# Patient Record
Sex: Female | Born: 1935 | Race: White | Hispanic: No | Smoking: Never smoker
Health system: Southern US, Community
[De-identification: ages and names within clinical notes are randomized; demographics above are authoritative.]

## PROBLEM LIST (undated history)

## (undated) DIAGNOSIS — I10 Essential (primary) hypertension: Secondary | ICD-10-CM

## (undated) DIAGNOSIS — F0390 Unspecified dementia without behavioral disturbance: Secondary | ICD-10-CM

## (undated) DIAGNOSIS — E7800 Pure hypercholesterolemia, unspecified: Secondary | ICD-10-CM

## (undated) DIAGNOSIS — E079 Disorder of thyroid, unspecified: Secondary | ICD-10-CM

## (undated) DIAGNOSIS — I219 Acute myocardial infarction, unspecified: Secondary | ICD-10-CM

## (undated) HISTORY — PX: BREAST SURGERY: SHX581

## (undated) HISTORY — PX: CORONARY ANGIOPLASTY WITH STENT PLACEMENT: SHX49

## (undated) HISTORY — PX: OOPHORECTOMY: SHX86

---

## 2016-12-23 ENCOUNTER — Emergency Department (HOSPITAL_BASED_OUTPATIENT_CLINIC_OR_DEPARTMENT_OTHER): Payer: Medicare Other

## 2016-12-23 ENCOUNTER — Emergency Department (HOSPITAL_BASED_OUTPATIENT_CLINIC_OR_DEPARTMENT_OTHER)
Admission: EM | Admit: 2016-12-23 | Discharge: 2016-12-23 | Disposition: A | Discharge status: Home / Self Care | Payer: Medicare Other | Attending: Emergency Medicine | Admitting: Emergency Medicine

## 2016-12-23 ENCOUNTER — Encounter (HOSPITAL_BASED_OUTPATIENT_CLINIC_OR_DEPARTMENT_OTHER): Payer: Self-pay

## 2016-12-23 DIAGNOSIS — I10 Essential (primary) hypertension: Secondary | ICD-10-CM | POA: Insufficient documentation

## 2016-12-23 DIAGNOSIS — R059 Cough, unspecified: Secondary | ICD-10-CM

## 2016-12-23 DIAGNOSIS — R911 Solitary pulmonary nodule: Secondary | ICD-10-CM

## 2016-12-23 DIAGNOSIS — R05 Cough: Secondary | ICD-10-CM

## 2016-12-23 HISTORY — DX: Essential (primary) hypertension: I10

## 2016-12-23 HISTORY — DX: Disorder of thyroid, unspecified: E07.9

## 2016-12-23 HISTORY — DX: Pure hypercholesterolemia, unspecified: E78.00

## 2016-12-23 HISTORY — DX: Unspecified dementia, unspecified severity, without behavioral disturbance, psychotic disturbance, mood disturbance, and anxiety: F03.90

## 2016-12-23 HISTORY — DX: Acute myocardial infarction, unspecified: I21.9

## 2016-12-23 LAB — CBC WITH DIFFERENTIAL/PLATELET
Basophils Absolute: 0 10*3/uL (ref 0.0–0.1)
Basophils Relative: 0 %
EOS PCT: 3 %
Eosinophils Absolute: 0.1 10*3/uL (ref 0.0–0.7)
HEMATOCRIT: 36.1 % (ref 36.0–46.0)
HEMOGLOBIN: 11.2 g/dL — AB (ref 12.0–15.0)
LYMPHS ABS: 1.1 10*3/uL (ref 0.7–4.0)
LYMPHS PCT: 33 %
MCH: 30.4 pg (ref 26.0–34.0)
MCHC: 31 g/dL (ref 30.0–36.0)
MCV: 98.1 fL (ref 78.0–100.0)
Monocytes Absolute: 0.5 10*3/uL (ref 0.1–1.0)
Monocytes Relative: 14 %
NEUTROS ABS: 1.6 10*3/uL — AB (ref 1.7–7.7)
Neutrophils Relative %: 50 %
Platelets: 150 10*3/uL (ref 150–400)
RBC: 3.68 MIL/uL — AB (ref 3.87–5.11)
RDW: 13.3 % (ref 11.5–15.5)
WBC: 3.2 10*3/uL — ABNORMAL LOW (ref 4.0–10.5)

## 2016-12-23 LAB — BASIC METABOLIC PANEL
Anion gap: 7 (ref 5–15)
BUN: 19 mg/dL (ref 6–20)
CHLORIDE: 105 mmol/L (ref 101–111)
CO2: 30 mmol/L (ref 22–32)
Calcium: 9 mg/dL (ref 8.9–10.3)
Creatinine, Ser: 1.09 mg/dL — ABNORMAL HIGH (ref 0.44–1.00)
GFR calc Af Amer: 50 mL/min — ABNORMAL LOW (ref >60)
GFR calc non Af Amer: 43 mL/min — ABNORMAL LOW (ref >60)
GLUCOSE: 94 mg/dL (ref 65–99)
POTASSIUM: 4.2 mmol/L (ref 3.5–5.1)
Sodium: 142 mmol/L (ref 135–145)

## 2016-12-23 MED ORDER — DOXYCYCLINE HYCLATE 100 MG PO CAPS: 100.0000 mg | ORAL_CAPSULE | Freq: Two times a day (BID) | ORAL | 0 refills | Status: DC

## 2016-12-23 NOTE — Discharge Instructions (Signed)
Your chest x-ray does not show obvious pneumonia, but given 3-4 weeks of your symptoms 12 antibiotics may be warranted. Please take antibiotics as prescribed.  View chest x-ray also shows  lung nodule in the right lower part of the lung. Please discuss this is their primary care doctor's you may need repeat x-rays down the road to make sure that nothing has changed with this nodule.  Please return without fail for worsening symptoms, including confusion, new fevers, difficulty breathing, or any other symptoms concerning to you

## 2016-12-23 NOTE — ED Notes (Signed)
C/o cough x 3 weeks  Productive at times, yellow  Denies pain

## 2016-12-23 NOTE — ED Provider Notes (Signed)
[facility]   CSN: [id number] Arrival date & time: 12/23/16  1621   By signing my name below, I, Nelwyn SalisburyJoshua Fowler, attest that this documentation has been prepared under the direction and in the presence of Lavera Guiseana Duo Pamila Mendibles, MD . Electronically Signed: Nelwyn SalisburyJoshua Fowler, Scribe. 12/23/2016. 7:49 PM.  History   Chief Complaint Chief Complaint  Patient presents with  . Cough   HPI  HPI Comments:  Suzanne Caldwell is a 83 y.o. female with pmhx of MI HTN and HLD who presents to the Emergency Department with daughter complaining of sudden-onset, persistent, non-productive cough onset 3 weeks ago. No modifying factors indicated. Pt reports associated rhinorrhea. She denies any sore throat, congestion, fever, chills, LE swelling, vomiting, diarrhea, dysuria, CP or SOB. Pt also denies any recent sick contacts.   Past Medical History:  Diagnosis Date  . Dementia   . High cholesterol   . Hypertension   . Myocardial infarct   . Thyroid disease     There are no active problems to display for this patient.   Past Surgical History:  Procedure Laterality Date  . BREAST SURGERY    . CORONARY ANGIOPLASTY WITH STENT PLACEMENT    . OOPHORECTOMY      OB History    No data available       Home Medications    Prior to Admission medications   Medication Sig Start Date End Date Taking? Authorizing Provider  doxycycline (VIBRAMYCIN) 100 MG capsule Take 1 capsule (100 mg total) by mouth 2 (two) times daily. 12/23/16   Lavera Guiseana Duo Peggy Loge, MD    Family History No family history on file.  Social History Social History  Substance Use Topics  . Smoking status: Never Smoker  . Smokeless tobacco: Never Used  . Alcohol use No     Allergies   Penicillins and Sulfa antibiotics   Review of Systems Review of Systems 10/14 Systems reviewed and are negative for acute change except as noted in the HPI.   Physical Exam Updated Vital Signs BP 158/76 (BP Location: Right Arm)    Pulse 72   Temp 97.9 F (36.6 C) (Oral)   Resp 18   Ht 5' (1.524 m)   Wt 173 lb (78.5 kg)   SpO2 96%   BMI 33.79 kg/m   Physical Exam Physical Exam  Nursing note and vitals reviewed. Constitutional: Well developed, well nourished, non-toxic, and in no acute distress Head: Normocephalic and atraumatic.  Mouth/Throat: Oropharynx is clear and moist.  Neck: Normal range of motion. Neck supple.  Cardiovascular: Normal rate and regular rhythm.   Pulmonary/Chest: Effort normal and breath sounds normal. Congestive cough.  Abdominal: Soft. There is no tenderness. There is no rebound and no guarding.  Musculoskeletal: Normal range of motion.  Neurological: Alert, no facial droop, fluent speech, moves all extremities symmetrically Skin: Skin is warm and dry.  Psychiatric: Cooperative  ED Treatments / Results  DIAGNOSTIC STUDIES:  Oxygen Saturation is 96% on RA, normal by my interpretation.    COORDINATION OF CARE:  9:27 PM Discussed treatment plan with pt at bedside and pt agreed to plan.  Labs (all labs ordered are listed, but only abnormal results are displayed) Labs Reviewed  CBC WITH DIFFERENTIAL/PLATELET - Abnormal; Notable for the following:       Result Value   WBC 3.2 (*)    RBC 3.68 (*)    Hemoglobin 11.2 (*)    Neutro Abs 1.6 (*)    All other components  within normal limits  BASIC METABOLIC PANEL - Abnormal; Notable for the following:    Creatinine, Ser 1.09 (*)    GFR calc non Af Amer 43 (*)    GFR calc Af Amer 50 (*)    All other components within normal limits    EKG  EKG Interpretation None       Radiology Dg Chest 2 View  Result Date: 12/23/2016 CLINICAL DATA:  Cough for 3 weeks EXAM: CHEST  2 VIEW COMPARISON:  None. FINDINGS: Normal heart size. Low volumes. Bibasilar atelectasis. 1 cm nodular density at the lateral left base. No pneumothorax or pleural effusion. Kyphosis of the thoracolumbar junction is noted. Chronic appearing L1 compression  deformity. IMPRESSION: Bibasilar atelectasis. Left basilar nodular density. CT is recommended to further evaluate. Electronically Signed   By: Jolaine Click M.D.   On: 12/23/2016 17:16    Procedures Procedures (including critical care time)  Medications Ordered in ED Medications - No data to display   Initial Impression / Assessment and Plan / ED Course  I have reviewed the triage vital signs and the nursing notes.  Pertinent labs & imaging results that were available during my care of the patient were reviewed by me and considered in my medical decision making (see chart for details).  Clinical Course as of Dec 23 2125  Fri Dec 23, 2016  2031 DG Chest 2 View [JF]    Clinical Course User Index [JF] Nelwyn Salisbury    Presenting with cough and congestion for over 3 weeks. She is nontoxic in no acute distress with normal vital signs. Normal work of breathing on room air with normal oxygenation. Lungs overall clear. Chest x-ray visualized lung nodule but no infiltrate or evidence of pneumonia or edema or other acute cardiopulmonary processes. Given length of symptoms, we'll trial course of antibiotics. No major systemic signs or symptoms of illness or respiratory distress that require admission. Strict return and follow-up instructions reviewed. She expressed understanding of all discharge instructions and felt comfortable with the plan of care.   Final Clinical Impressions(s) / ED Diagnoses   Final diagnoses:  Cough  Lung nodule    New Prescriptions New Prescriptions   DOXYCYCLINE (VIBRAMYCIN) 100 MG CAPSULE    Take 1 capsule (100 mg total) by mouth 2 (two) times daily.   I personally performed the services described in this documentation, which was scribed in my presence. The recorded information has been reviewed and is accurate.     Lavera Guise, MD 12/23/16 2127

## 2016-12-23 NOTE — ED Triage Notes (Signed)
C/o cough x 2 weeks- per daughter-pt NAD-steady gait

## 2017-10-24 IMAGING — DX DG CHEST 2V
2 series · 2 of 2 positions shown · non-contrast
Comparison: None.

CLINICAL DATA: Cough for 3 weeks

EXAM:
CHEST  2 VIEW

[chest pa]
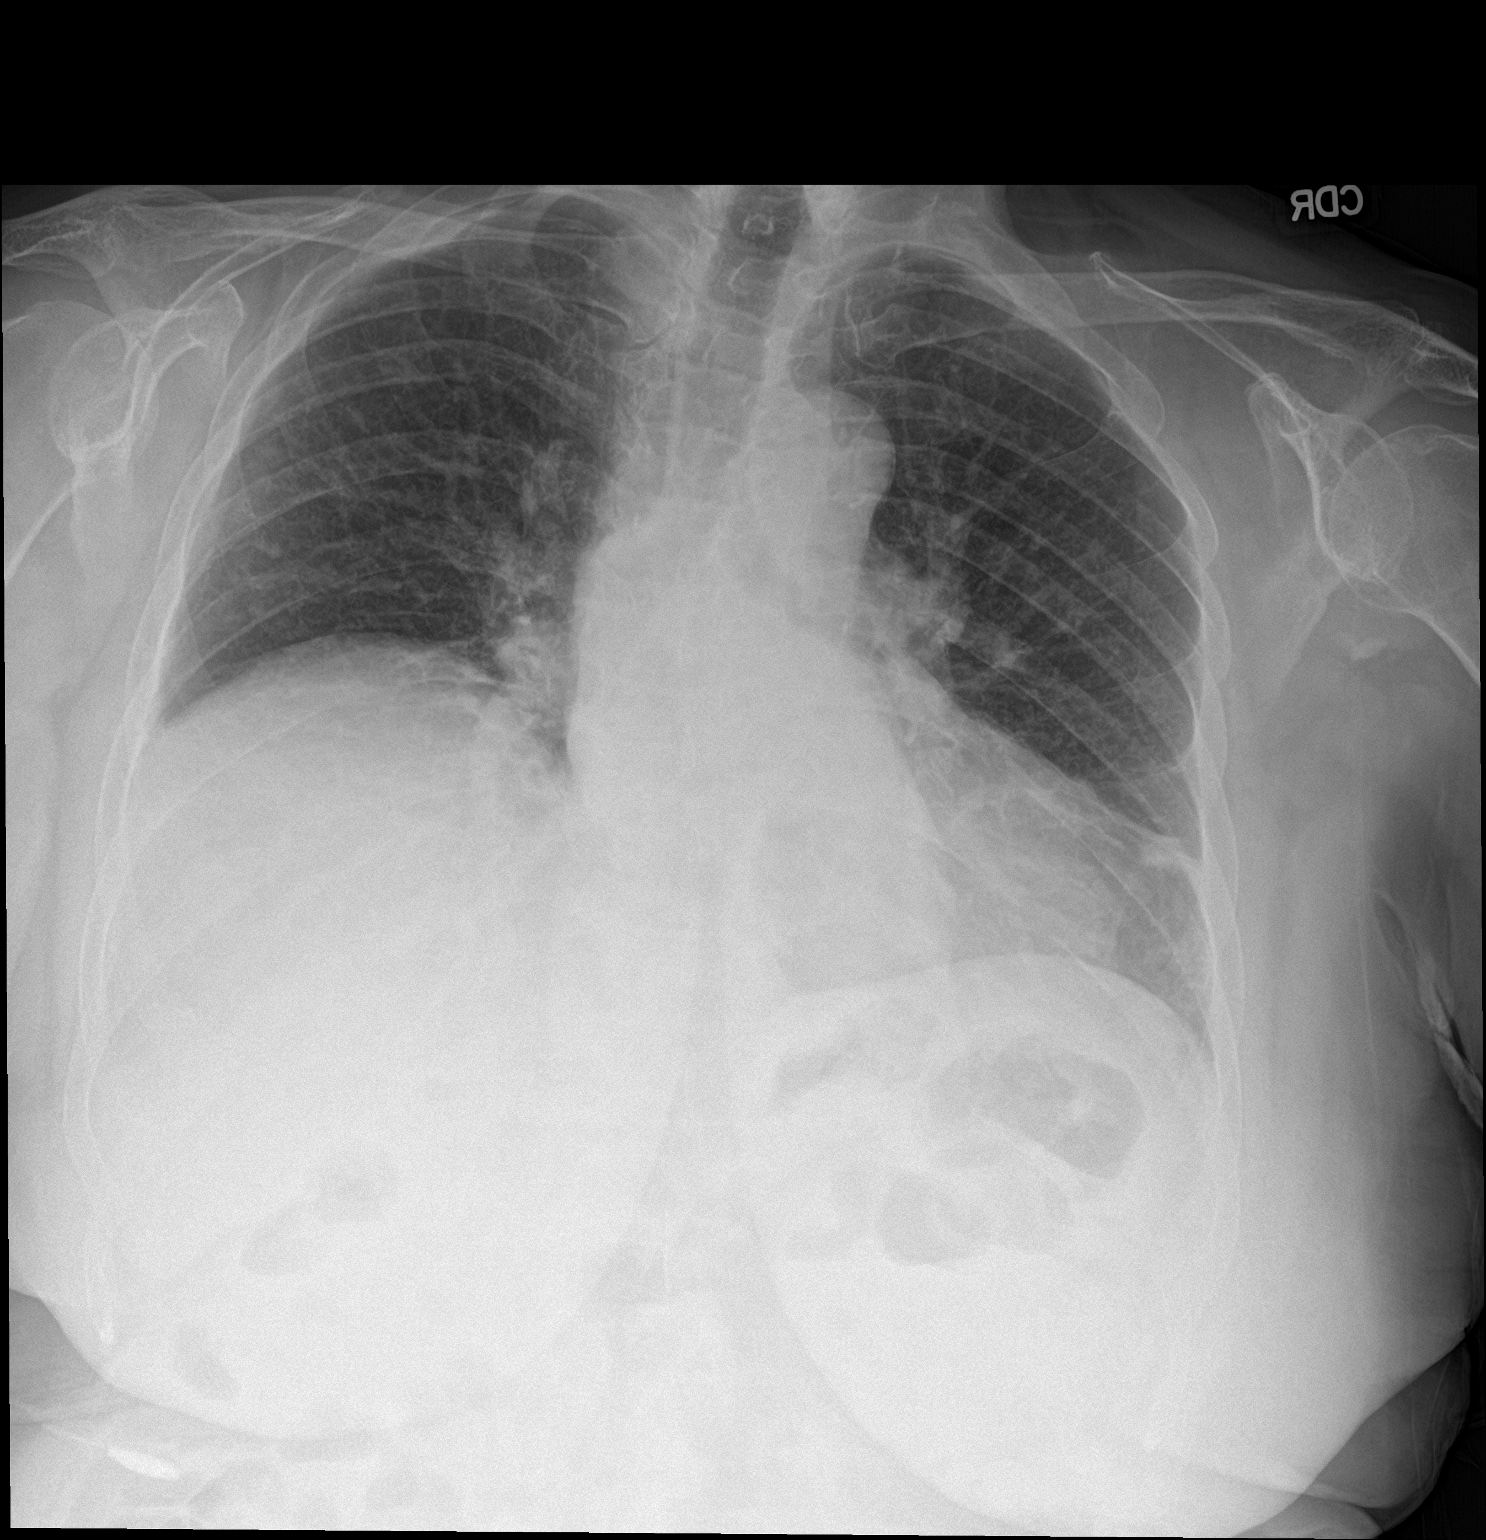

[chest lat]
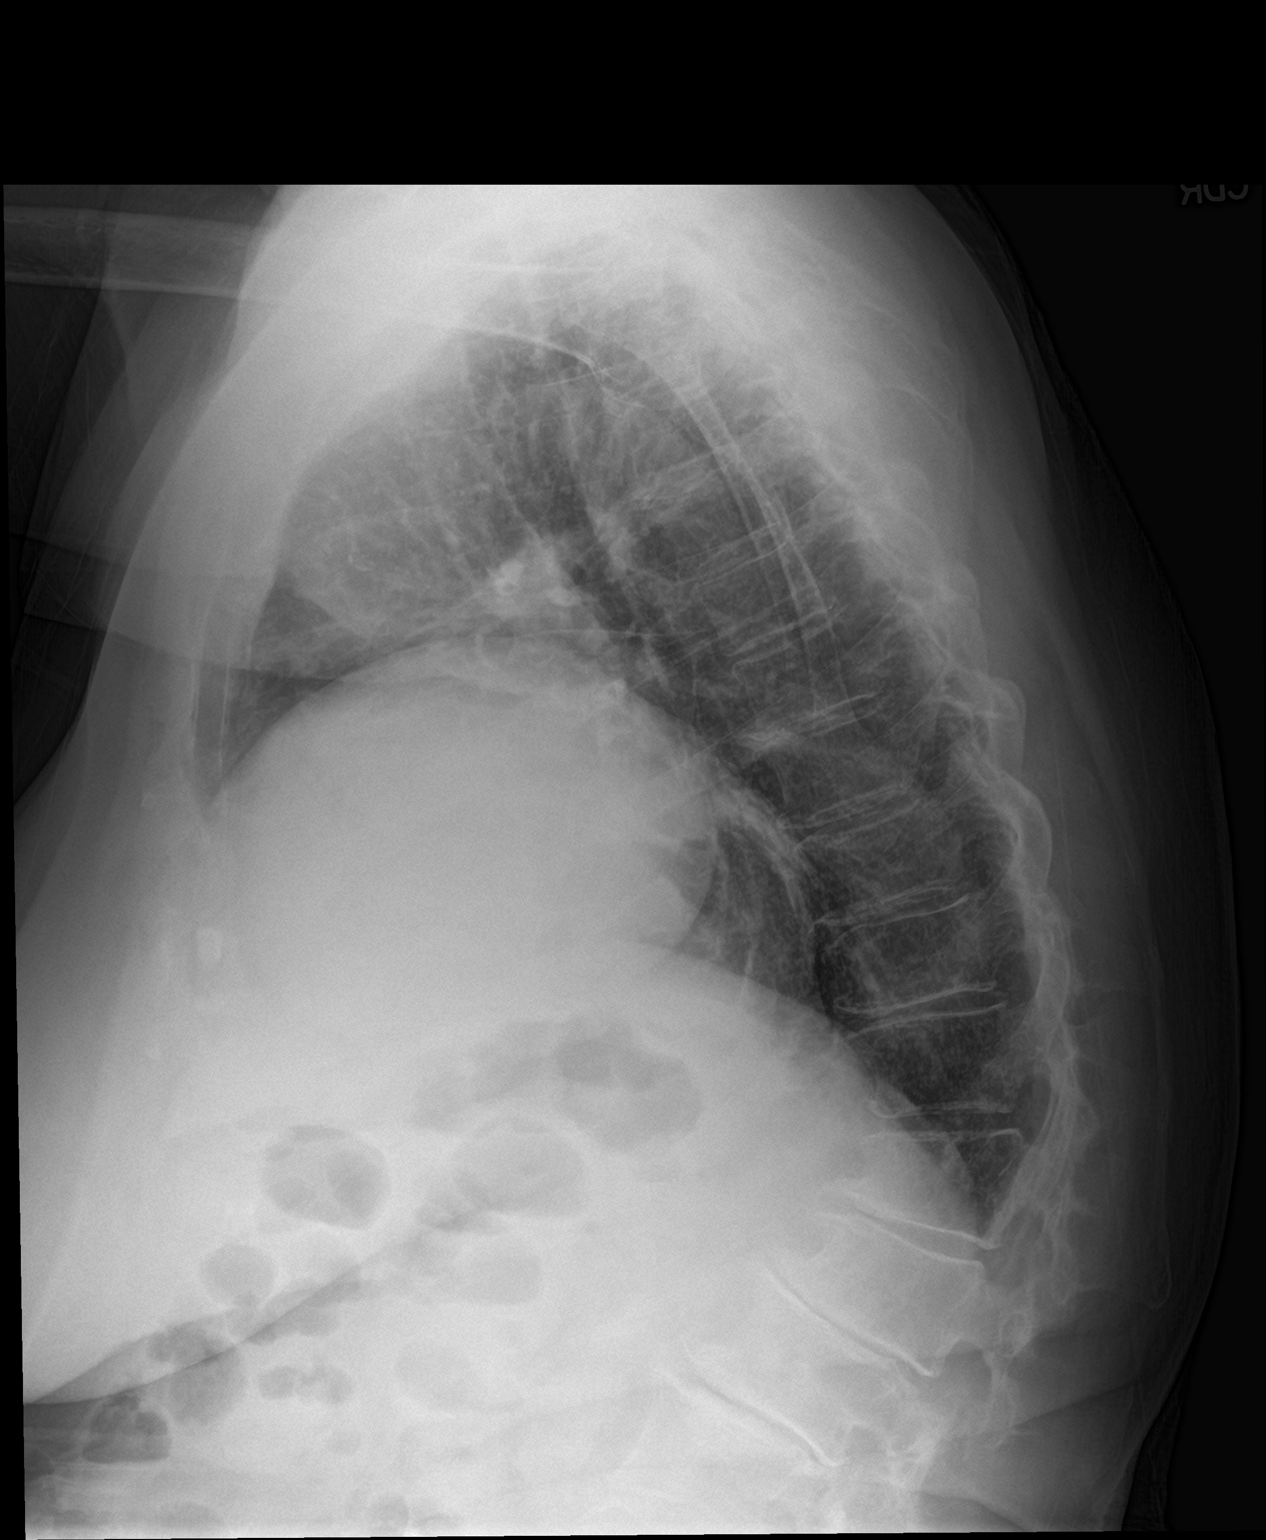

[2 of 2 positions shown; findings below may reference images not displayed]

FINDINGS: Normal heart size. Low volumes. Bibasilar atelectasis. 1 cm nodular
density at the lateral left base. No pneumothorax or pleural
effusion. Kyphosis of the thoracolumbar junction is noted. Chronic
appearing L1 compression deformity.
IMPRESSION: Bibasilar atelectasis.

Left basilar nodular density. CT is recommended to further evaluate.

## 2024-03-05 DEATH — deceased
